# Patient Record
Sex: Male | Born: 1955 | Race: Black or African American | Hispanic: No | Marital: Single | Smoking: Current every day smoker
Health system: Southern US, Community
[De-identification: ages and names within clinical notes are randomized; demographics above are authoritative.]

## PROBLEM LIST (undated history)

## (undated) DIAGNOSIS — I10 Essential (primary) hypertension: Secondary | ICD-10-CM

## (undated) HISTORY — PX: ROTATOR CUFF REPAIR: SHX139

---

## 2016-09-10 ENCOUNTER — Emergency Department (HOSPITAL_BASED_OUTPATIENT_CLINIC_OR_DEPARTMENT_OTHER): Payer: BLUE CROSS/BLUE SHIELD

## 2016-09-10 ENCOUNTER — Encounter (HOSPITAL_BASED_OUTPATIENT_CLINIC_OR_DEPARTMENT_OTHER): Payer: Self-pay | Admitting: Emergency Medicine

## 2016-09-10 ENCOUNTER — Emergency Department (HOSPITAL_BASED_OUTPATIENT_CLINIC_OR_DEPARTMENT_OTHER)
Admission: EM | Admit: 2016-09-10 | Discharge: 2016-09-10 | Disposition: A | Discharge status: Home / Self Care | Payer: BLUE CROSS/BLUE SHIELD | Attending: Emergency Medicine | Admitting: Emergency Medicine

## 2016-09-10 DIAGNOSIS — B9789 Other viral agents as the cause of diseases classified elsewhere: Secondary | ICD-10-CM

## 2016-09-10 DIAGNOSIS — F17210 Nicotine dependence, cigarettes, uncomplicated: Secondary | ICD-10-CM | POA: Insufficient documentation

## 2016-09-10 DIAGNOSIS — J069 Acute upper respiratory infection, unspecified: Secondary | ICD-10-CM | POA: Diagnosis not present

## 2016-09-10 DIAGNOSIS — I10 Essential (primary) hypertension: Secondary | ICD-10-CM | POA: Insufficient documentation

## 2016-09-10 DIAGNOSIS — R05 Cough: Secondary | ICD-10-CM | POA: Diagnosis present

## 2016-09-10 HISTORY — DX: Essential (primary) hypertension: I10

## 2016-09-10 MED ORDER — LORATADINE 10 MG PO TABS: 10.0000 mg | ORAL_TABLET | Freq: Every day | ORAL | 0 refills | Status: DC

## 2016-09-10 MED ORDER — DM-GUAIFENESIN ER 30-600 MG PO TB12: 1.0000 | ORAL_TABLET | Freq: Two times a day (BID) | ORAL | 0 refills | Status: DC | PRN

## 2016-09-10 NOTE — ED Notes (Signed)
Patient given pedialyte, ok by [place].

## 2016-09-10 NOTE — ED Triage Notes (Signed)
Productive cough for a week , and H/A last night. Denies n/v or diarrhea

## 2016-09-10 NOTE — ED Notes (Signed)
ED Provider at bedside. 

## 2016-09-10 NOTE — ED Notes (Signed)
Patient c/o chronic cough with white mucous, ongoing for "a long time". Patient is a long term smoker, average 1 ppd. Patient denies any pain at this time, is A&Ox4, NAD noted. Patient using cough drops at home without significant relief. Patient states he had a headache last night, treated with "2 tylenol" and it is gone now.

## 2016-09-10 NOTE — ED Provider Notes (Signed)
[facility]   CSN: [id number] Arrival date & time: 09/10/16  1033     History   Chief Complaint Chief Complaint  Patient presents with  . Cough    HPI Charles Kaufman is a 61 y.o. male.  The history is provided by the patient.  Cough  This is a new problem. Episode onset: 1 week. The problem occurs constantly. The problem has not changed since onset.The cough is productive of sputum. There has been no fever. Pertinent negatives include no shortness of breath. He has tried nothing for the symptoms. He is a smoker (1/2 ppd). His past medical history does not include COPD or asthma.    Past Medical History:  Diagnosis Date  . Hypertension     There are no active problems to display for this patient.   Past Surgical History:  Procedure Laterality Date  . ROTATOR CUFF REPAIR Right        Home Medications    Prior to Admission medications   Not on File    Family History No family history on file.  Social History Social History  Substance Use Topics  . Smoking status: Current Every Day Smoker    Packs/day: 0.50    Types: Cigarettes  . Smokeless tobacco: Never Used  . Alcohol use No     Allergies   Sulfa antibiotics   Review of Systems Review of Systems  Respiratory: Positive for cough. Negative for shortness of breath.   All other systems reviewed and are negative.    Physical Exam Updated Vital Signs BP 115/77 (BP Location: Right Arm)   Pulse (!) 50   Temp 98.2 F (36.8 C) (Oral)   Resp 18   Ht 5\' 4"  (1.626 m)   Wt 180 lb (81.6 kg)   SpO2 98%   BMI 30.90 kg/m   Physical Exam  Constitutional: He is oriented to person, place, and time. He appears well-developed and well-nourished. No distress.  HENT:  Head: Normocephalic and atraumatic.  Nose: Nose normal.  Mouth/Throat: Oropharynx is clear and moist.  Eyes: Conjunctivae are normal.  Neck: Neck supple. No tracheal deviation present.  Cardiovascular: Normal  rate, regular rhythm and normal heart sounds.   Pulmonary/Chest: Effort normal and breath sounds normal. No respiratory distress. He has no wheezes. He has no rales.  Abdominal: Soft. He exhibits no distension.  Neurological: He is alert and oriented to person, place, and time.  Skin: Skin is warm and dry. Capillary refill takes less than 2 seconds.  Psychiatric: He has a normal mood and affect.  Vitals reviewed.    ED Treatments / Results  Labs (all labs ordered are listed, but only abnormal results are displayed) Labs Reviewed - No data to display  EKG  EKG Interpretation None       Radiology Dg Chest 2 View  Result Date: 09/10/2016 CLINICAL DATA:  Productive cough for 1 week. EXAM: CHEST  2 VIEW COMPARISON:  None. FINDINGS: The heart size and mediastinal contours are within normal limits. Both lungs are clear. The visualized skeletal structures are unremarkable. IMPRESSION: No active cardiopulmonary disease. Electronically Signed   By: Ted Mcalpine M.D.   On: 09/10/2016 12:03    Procedures Procedures (including critical care time)  Medications Ordered in ED Medications - No data to display   Initial Impression / Assessment and Plan / ED Course  I have reviewed the triage vital signs and the nursing notes.  Pertinent labs & imaging results that were available  during my care of the patient were reviewed by me and considered in my medical decision making (see chart for details).     61 year old well-appearing male presents with upper respiratory infection symptoms including runny nose, cough, congestion and sputum production. Chest x-ray is negative for pneumonia. He is breathing well without signs of impending respiratory failure. Plan will be for supportive care with antihistamines and cough suppression/expectorant.  Discussed smoking cessation with patient and was they were offerred resources to help stop.  Total time was 5 min CPT code [id number].    Final  Clinical Impressions(s) / ED Diagnoses   Final diagnoses:  Viral URI with cough    New Prescriptions New Prescriptions   DEXTROMETHORPHAN-GUAIFENESIN (MUCINEX DM) 30-600 MG 12HR TABLET    Take 1 tablet by mouth 2 (two) times daily as needed for cough.   LORATADINE (CLARITIN) 10 MG TABLET    Take 1 tablet (10 mg total) by mouth daily.     Lyndal Pulleyaniel Odel Schmid, MD 09/10/16 [phone]

## 2017-04-25 ENCOUNTER — Encounter (HOSPITAL_BASED_OUTPATIENT_CLINIC_OR_DEPARTMENT_OTHER): Payer: Self-pay

## 2017-04-25 ENCOUNTER — Emergency Department (HOSPITAL_BASED_OUTPATIENT_CLINIC_OR_DEPARTMENT_OTHER)
Admission: EM | Admit: 2017-04-25 | Discharge: 2017-04-25 | Disposition: A | Discharge status: Home / Self Care | Payer: BLUE CROSS/BLUE SHIELD | Attending: Emergency Medicine | Admitting: Emergency Medicine

## 2017-04-25 DIAGNOSIS — I10 Essential (primary) hypertension: Secondary | ICD-10-CM | POA: Diagnosis not present

## 2017-04-25 DIAGNOSIS — F17210 Nicotine dependence, cigarettes, uncomplicated: Secondary | ICD-10-CM | POA: Diagnosis not present

## 2017-04-25 DIAGNOSIS — M25531 Pain in right wrist: Secondary | ICD-10-CM | POA: Diagnosis present

## 2017-04-25 MED ORDER — IBUPROFEN 400 MG PO TABS
600.0000 mg | ORAL_TABLET | Freq: Once | ORAL | Status: AC
  Administered 2017-04-25: 12:00:00 600 mg via ORAL
  Filled 2017-04-25: qty 1

## 2017-04-25 MED ORDER — IBUPROFEN 600 MG PO TABS: 600.0000 mg | ORAL_TABLET | Freq: Three times a day (TID) | ORAL | 0 refills | Status: AC

## 2017-04-25 MED FILL — IBUPROFEN 600 MG TABLET: 600 | 10 days supply | Qty: 30 | Fill #0

## 2017-04-25 NOTE — ED Notes (Signed)
Pt verbalized understanding of discharge instructions and denies any further questions at this time.   

## 2017-04-25 NOTE — ED Provider Notes (Signed)
[facility]   CSN: [id number] Arrival date & time: 04/25/17  1113     History   Chief Complaint Chief Complaint  Patient presents with  . Wrist Pain    HPI Charles Kaufman is a 61 y.o. male presenting with right hand pain.  Patient states that while he was at work yesterday, he started to develop some ulnar right hand/wrist pain. It gradually increased, and when he woke up this morning, pain was more severe. He has not taken anything for pain. He states he uses his hands a lot for work, and yesterday he was picking up garbage bags. He is right-handed. He denies numbness or tingling. He denies fall, trauma, or injury to the wrist. He denies history of problems with the wrist. He has no pain on the radial aspect, in his palm, or in his fingers.  He denies fevers or chills. Movement makes the pain worse, nothing has made it better.  HPI  Past Medical History:  Diagnosis Date  . Hypertension     There are no active problems to display for this patient.   Past Surgical History:  Procedure Laterality Date  . ROTATOR CUFF REPAIR Right        Home Medications    Prior to Admission medications   Medication Sig Start Date End Date Taking? Authorizing Provider  ibuprofen (ADVIL,MOTRIN) 600 MG tablet Take 1 tablet (600 mg total) by mouth every 8 (eight) hours. 04/25/17   Bryceton Hantz, PA-C    Family History No family history on file.  Social History Social History  Substance Use Topics  . Smoking status: Current Every Day Smoker    Packs/day: 0.50    Types: Cigarettes  . Smokeless tobacco: Never Used  . Alcohol use No     Allergies   Sulfa antibiotics   Review of Systems Review of Systems  Constitutional: Negative for chills and fever.  Musculoskeletal: Positive for arthralgias.  Skin: Negative for wound.  Neurological: Negative for numbness.     Physical Exam Updated Vital Signs BP (!) 144/64 (BP Location: Left Arm)   Pulse  (!) 57   Temp 98.2 F (36.8 C) (Oral)   Resp 16   Ht  (1.626 m)   Wt 80.1 kg (176 lb 9.4 oz)   SpO2 97%   BMI 30.31 kg/m   Physical Exam  Constitutional: He is oriented to person, place, and time. He appears well-developed and well-nourished. No distress.  HENT:  Head: Normocephalic and atraumatic.  Eyes: EOM are normal.  Neck: Normal range of motion.  Pulmonary/Chest: Effort normal.  Abdominal: He exhibits no distension.  Musculoskeletal: Normal range of motion.       Arms: Tenderness to palpation of the ulnar aspect of the right wrist and hand. No obvious swelling of the wrist or hand. No lacerations, contusions, or injury. Patient with full range of motion of his fingers without pain. Grip strength equal bilaterally. Radial pulses equal bilaterally. Sensation intact bilaterally. No pain with passive range of motion of the wrist. No erythema or warmth. No pain in the elbow. No tenderness to palpation of the radial aspect of the wrist or hand.  Neurological: He is alert and oriented to person, place, and time. No sensory deficit.  Skin: Skin is warm. No rash noted.  Psychiatric: He has a normal mood and affect.  Nursing note and vitals reviewed.    ED Treatments / Results  Labs (all labs ordered are listed, but only abnormal  results are displayed) Labs Reviewed - No data to display  EKG  EKG Interpretation None       Radiology No results found.  Procedures Procedures (including critical care time)  Medications Ordered in ED Medications  ibuprofen (ADVIL,MOTRIN) tablet 600 mg (600 mg Oral Given 04/25/17 1208)     Initial Impression / Assessment and Plan / ED Course  I have reviewed the triage vital signs and the nursing notes.  Pertinent labs & imaging results that were available during my care of the patient were reviewed by me and considered in my medical decision making (see chart for details).     Patient presenting with ulnar wrist/hand pain,  worsened with activity. Physical exam reassuring, as pain crosses several joints, likely not gout, septic joint, or injury of a joint. Likely muscular or ligamentous. At this time, I do not believe x-rays are indicated. Patient neurovascularly intact. Will treat conservatively with anti-inflammatories and a brace. Patient to follow-up with primary care if symptoms are not improving. At this time, patient appears safe for discharge. Return precautions given. Patient states he understands and agrees to plan.  Final Clinical Impressions(s) / ED Diagnoses   Final diagnoses:  Right wrist pain    New Prescriptions Discharge Medication List as of 04/25/2017 11:55 AM    START taking these medications   Details  ibuprofen (ADVIL,MOTRIN) 600 MG tablet Take 1 tablet (600 mg total) by mouth every 8 (eight) hours., Starting Wed 04/25/2017, Print         [place], [place], PA-C 04/25/17 1309    Melene PlanFloyd, Dan, DO 04/25/17 1534

## 2017-04-25 NOTE — Discharge Instructions (Signed)
Take ibuprofen 3 times a day with meals. Do not take other anti-inflammatories at the same time (Advil, Motrin, naproxen, Aleve). You may supplement with Tylenol as needed for further pain. Take ibuprofen for the next 7 days. You may use ice as needed for pain and swelling. Use the wrist brace when working and as needed for symptom control. Do not sleep in this. Follow-up with your primary care doctor in 1 week if pain is not improving. Return to the emergency room if you develop swelling and redness of your wrist, numbness that does not improve, or any new or worsening symptoms.

## 2017-04-25 NOTE — ED Triage Notes (Signed)
C/o pain to right wrist x today-denies specific injury-his work does involve lifting/moving beds-NAD-steady gait

## 2017-06-10 ENCOUNTER — Emergency Department (HOSPITAL_BASED_OUTPATIENT_CLINIC_OR_DEPARTMENT_OTHER): Payer: BLUE CROSS/BLUE SHIELD

## 2017-06-10 ENCOUNTER — Emergency Department (HOSPITAL_BASED_OUTPATIENT_CLINIC_OR_DEPARTMENT_OTHER)
Admission: EM | Admit: 2017-06-10 | Discharge: 2017-06-11 | Disposition: A | Discharge status: Home / Self Care | Payer: BLUE CROSS/BLUE SHIELD | Attending: Emergency Medicine | Admitting: Emergency Medicine

## 2017-06-10 ENCOUNTER — Encounter (HOSPITAL_BASED_OUTPATIENT_CLINIC_OR_DEPARTMENT_OTHER): Payer: Self-pay | Admitting: Emergency Medicine

## 2017-06-10 DIAGNOSIS — I10 Essential (primary) hypertension: Secondary | ICD-10-CM | POA: Diagnosis not present

## 2017-06-10 DIAGNOSIS — M79661 Pain in right lower leg: Secondary | ICD-10-CM | POA: Insufficient documentation

## 2017-06-10 DIAGNOSIS — F17210 Nicotine dependence, cigarettes, uncomplicated: Secondary | ICD-10-CM | POA: Insufficient documentation

## 2017-06-10 DIAGNOSIS — Z79899 Other long term (current) drug therapy: Secondary | ICD-10-CM | POA: Diagnosis not present

## 2017-06-10 DIAGNOSIS — M79662 Pain in left lower leg: Secondary | ICD-10-CM | POA: Insufficient documentation

## 2017-06-10 DIAGNOSIS — M79641 Pain in right hand: Secondary | ICD-10-CM

## 2017-06-10 LAB — CBC WITH DIFFERENTIAL/PLATELET
Basophils Absolute: 0 10*3/uL (ref 0.0–0.1)
Basophils Relative: 0 %
EOS ABS: 0.1 10*3/uL (ref 0.0–0.7)
EOS PCT: 2 %
HCT: 40.6 % (ref 39.0–52.0)
Hemoglobin: 13.5 g/dL (ref 13.0–17.0)
LYMPHS ABS: 2.5 10*3/uL (ref 0.7–4.0)
LYMPHS PCT: 34 %
MCH: 32.5 pg (ref 26.0–34.0)
MCHC: 33.3 g/dL (ref 30.0–36.0)
MCV: 97.8 fL (ref 78.0–100.0)
MONOS PCT: 5 %
Monocytes Absolute: 0.4 10*3/uL (ref 0.1–1.0)
Neutro Abs: 4.4 10*3/uL (ref 1.7–7.7)
Neutrophils Relative %: 59 %
PLATELETS: 142 10*3/uL — AB (ref 150–400)
RBC: 4.15 MIL/uL — ABNORMAL LOW (ref 4.22–5.81)
RDW: 13.9 % (ref 11.5–15.5)
WBC: 7.4 10*3/uL (ref 4.0–10.5)

## 2017-06-10 NOTE — ED Triage Notes (Signed)
R hand and bilateral leg pain for 1 week. Denies injury.

## 2017-06-10 NOTE — ED Provider Notes (Signed)
[facility] EMERGENCY DEPARTMENT Provider Note   CSN: [id number] Arrival date & time: 06/10/17  1709     History   Chief Complaint Chief Complaint  Patient presents with  . Hand Pain  . Leg Pain    HPI Charles Kaufman is a 61 y.o. male.  Patient is a 61 year old male with past medical history of hypertension presenting today for evaluation of pain in his right hand.  This is been ongoing for the past month and began in the absence of any injury or trauma.  He reports performing repetitive motions and heavy lifting at work.  He was seen here 2 weeks ago and prescribed NSAIDs, however this is not helping.  He is also describing pain in his calves.  He reports cramping at night and cramping intermittently with ambulation.  He denies any chest pain or difficulty breathing.  He denies any leg swelling.   The history is provided by the patient.  Hand Pain  This is a new problem. Episode onset: 1 month ago. The problem occurs constantly. The problem has been gradually worsening. Pertinent negatives include no chest pain. Exacerbated by: Movement and palpation. Nothing relieves the symptoms. He has tried nothing for the symptoms. The treatment provided no relief.    Past Medical History:  Diagnosis Date  . Hypertension     There are no active problems to display for this patient.   Past Surgical History:  Procedure Laterality Date  . ROTATOR CUFF REPAIR Right        Home Medications    Prior to Admission medications   Medication Sig Start Date End Date Taking? Authorizing Provider  lisinopril-hydrochlorothiazide (PRINZIDE,ZESTORETIC) 10-12.5 MG tablet Take 1 tablet by mouth daily.   Yes [provider]  ibuprofen (ADVIL,MOTRIN) 600 MG tablet Take 1 tablet (600 mg total) by mouth every 8 (eight) hours. 04/25/17   Caccavale, Sophia, PA-C    Family History No family history on file.  Social History Social History  Substance Use Topics  . Smoking status:  Current Every Day Smoker    Packs/day: 0.50    Types: Cigarettes  . Smokeless tobacco: Never Used  . Alcohol use No     Allergies   Sulfa antibiotics   Review of Systems Review of Systems  Cardiovascular: Negative for chest pain.  All other systems reviewed and are negative.    Physical Exam Updated Vital Signs BP 138/64 (BP Location: Left Arm)   Pulse (!) 51   Temp 98 F (36.7 C) (Oral)   Resp 16   Ht 5\' 4"  (1.626 m)   Wt 80.7 kg (178 lb)   SpO2 98%   BMI 30.55 kg/m   Physical Exam  Constitutional: He is oriented to person, place, and time. He appears well-developed and well-nourished. No distress.  HENT:  Head: Normocephalic and atraumatic.  Mouth/Throat: Oropharynx is clear and moist.  Neck: Normal range of motion. Neck supple.  Cardiovascular: Normal rate and regular rhythm.  Exam reveals no friction rub.   No murmur heard. Pulmonary/Chest: Effort normal and breath sounds normal. No respiratory distress. He has no wheezes. He has no rales.  Abdominal: Soft. Bowel sounds are normal. He exhibits no distension. There is no tenderness.  Musculoskeletal: Normal range of motion. He exhibits no edema.  The right hand appears grossly normal.  He reports discomfort in the area of the MCP joints.  There is no obvious swelling, erythema, or other abnormality.  Ulnar and radial pulses are easily palpable.  Capillary refill is brisk to all fingers.  The lower extremities appear grossly normal and symmetrical.  There is no edema or swelling.  There is no redness or erythema.  DP pulses are easily palpable.  Motor and sensation is intact to both feet.  Neurological: He is alert and oriented to person, place, and time. Coordination normal.  Skin: Skin is warm and dry. He is not diaphoretic.  Nursing note and vitals reviewed.    ED Treatments / Results  Labs (all labs ordered are listed, but only abnormal results are displayed) Labs Reviewed  BASIC METABOLIC PANEL  CBC  WITH DIFFERENTIAL/PLATELET    EKG  EKG Interpretation None       Radiology No results found.  Procedures Procedures (including critical care time)  Medications Ordered in ED Medications - No data to display   Initial Impression / Assessment and Plan / ED Course  I have reviewed the triage vital signs and the nursing notes.  Pertinent labs & imaging results that were available during my care of the patient were reviewed by me and considered in my medical decision making (see chart for details).  Hand x-rays are negative.  Patient's laboratory studies reveal no acute abnormality.  His blood counts are normal and his potassium is normal.  I am uncertain as to the etiology of his calf cramping, however I suspect a musculoskeletal etiology.  His pain is bilateral and I highly doubt DVT.  He will be treated with prednisone for his hand pain since the ibuprofen he was prescribed is not working.  He is also requested a work note which he will be given.  He is to follow-up with his primary doctor if not improving.  Final Clinical Impressions(s) / ED Diagnoses   Final diagnoses:  None    New Prescriptions New Prescriptions   No medications on file     Geoffery Lyonselo, Taveon Enyeart, MD 06/11/17 [phone]

## 2017-06-10 NOTE — ED Notes (Signed)
EMT attempted stick X2. Successful for one tube the second time.

## 2017-06-11 LAB — BASIC METABOLIC PANEL
ANION GAP: 6 (ref 5–15)
BUN: 12 mg/dL (ref 6–20)
CHLORIDE: 105 mmol/L (ref 101–111)
CO2: 25 mmol/L (ref 22–32)
Calcium: 8.8 mg/dL — ABNORMAL LOW (ref 8.9–10.3)
Creatinine, Ser: 1.16 mg/dL (ref 0.61–1.24)
GFR calc Af Amer: >60 mL/min (ref >60)
GFR calc non Af Amer: >60 mL/min (ref >60)
GLUCOSE: 102 mg/dL — AB (ref 65–99)
POTASSIUM: 3.6 mmol/L (ref 3.5–5.1)
Sodium: 136 mmol/L (ref 135–145)

## 2017-06-11 MED ORDER — PREDNISONE 10 MG PO TABS: 20.0000 mg | ORAL_TABLET | Freq: Two times a day (BID) | ORAL | 0 refills | Status: AC

## 2017-06-11 NOTE — Discharge Instructions (Signed)
Prednisone as prescribed.  Continue ibuprofen 600 mg every 6 hours as needed for pain.  Follow-up with your primary doctor if your symptoms are not improving in the next week.

## 2017-12-27 IMAGING — CR DG CHEST 2V
2 series · 2 of 2 positions shown · non-contrast
Comparison: None.

CLINICAL DATA: Productive cough for 1 week.

EXAM:
CHEST  2 VIEW

[w chest pa]
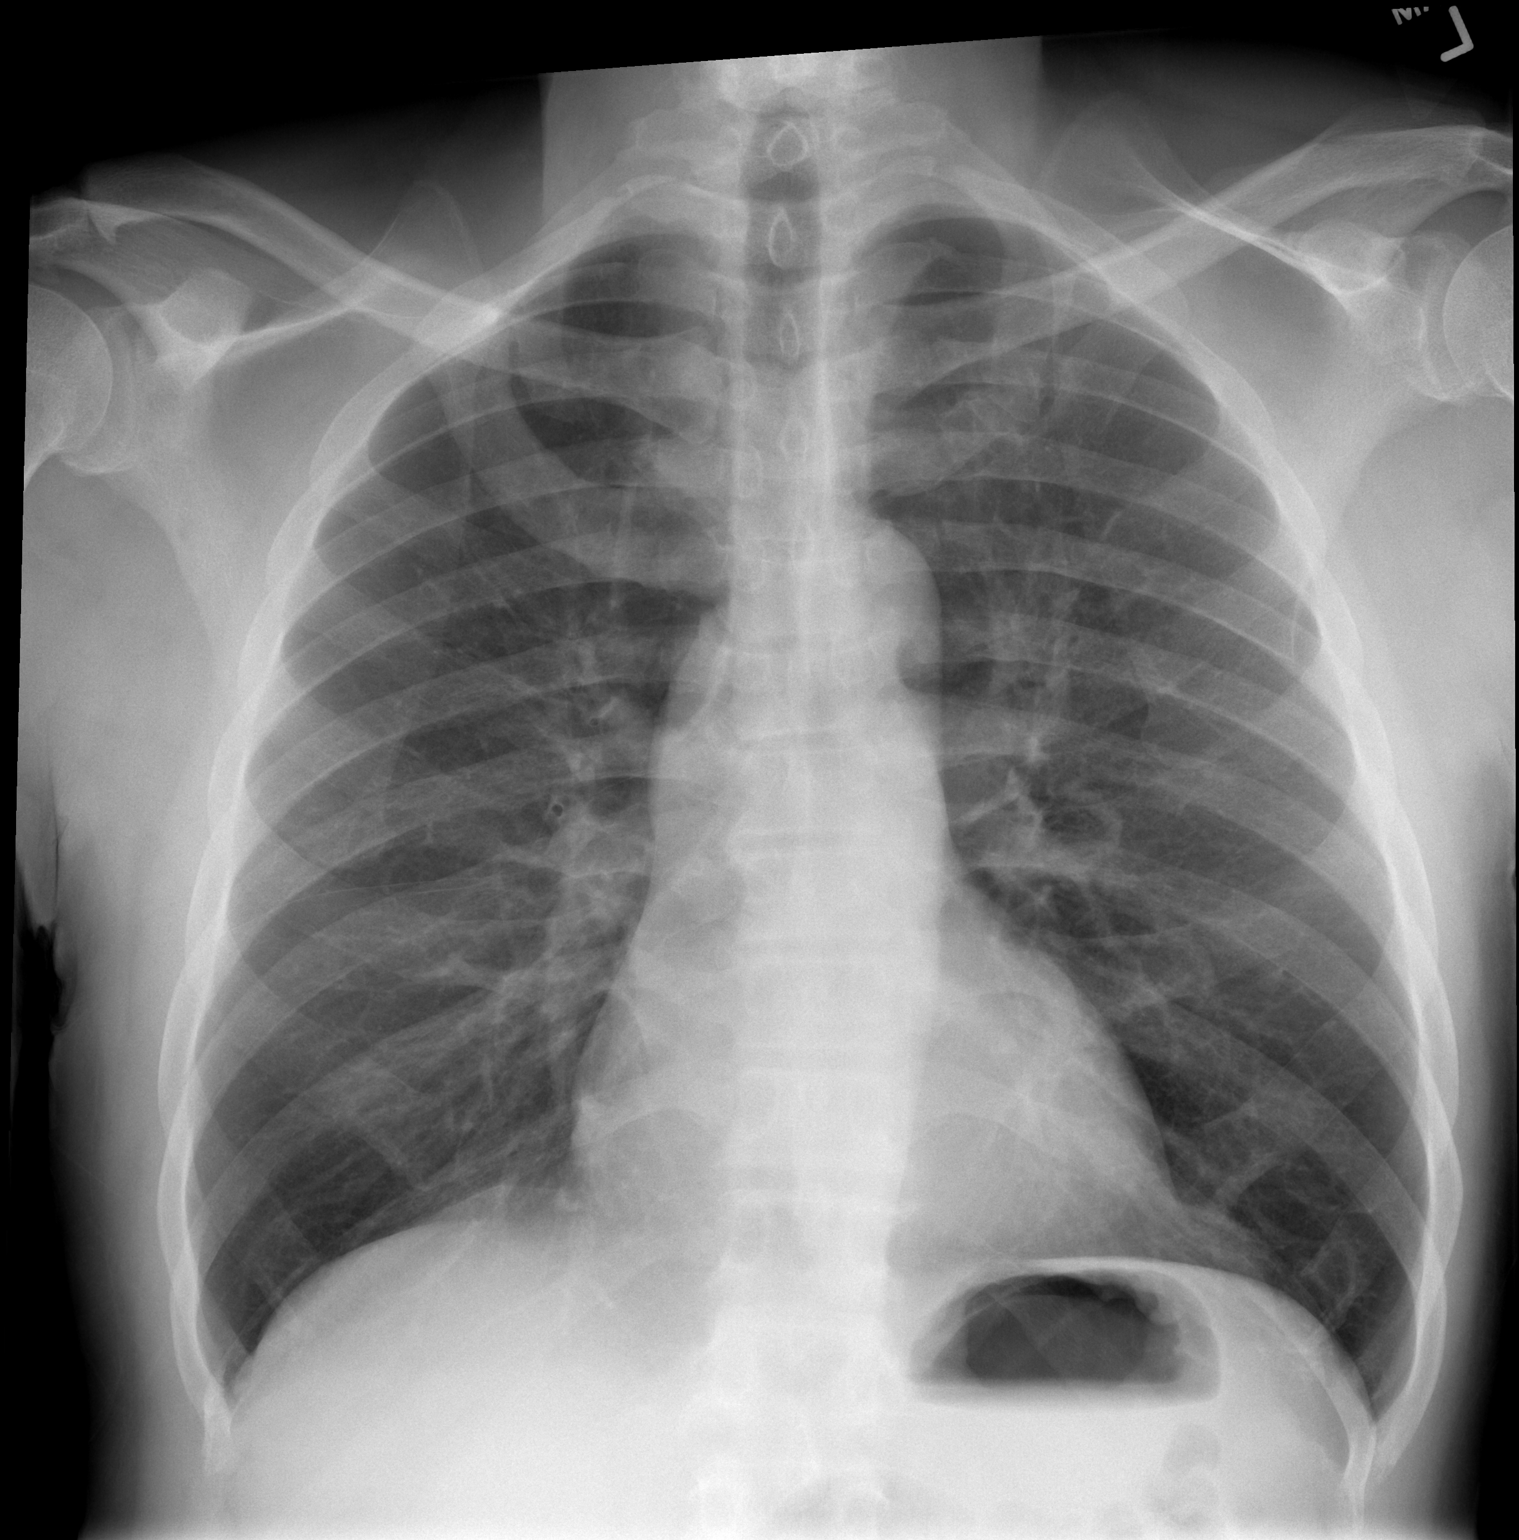

[w chest lat]
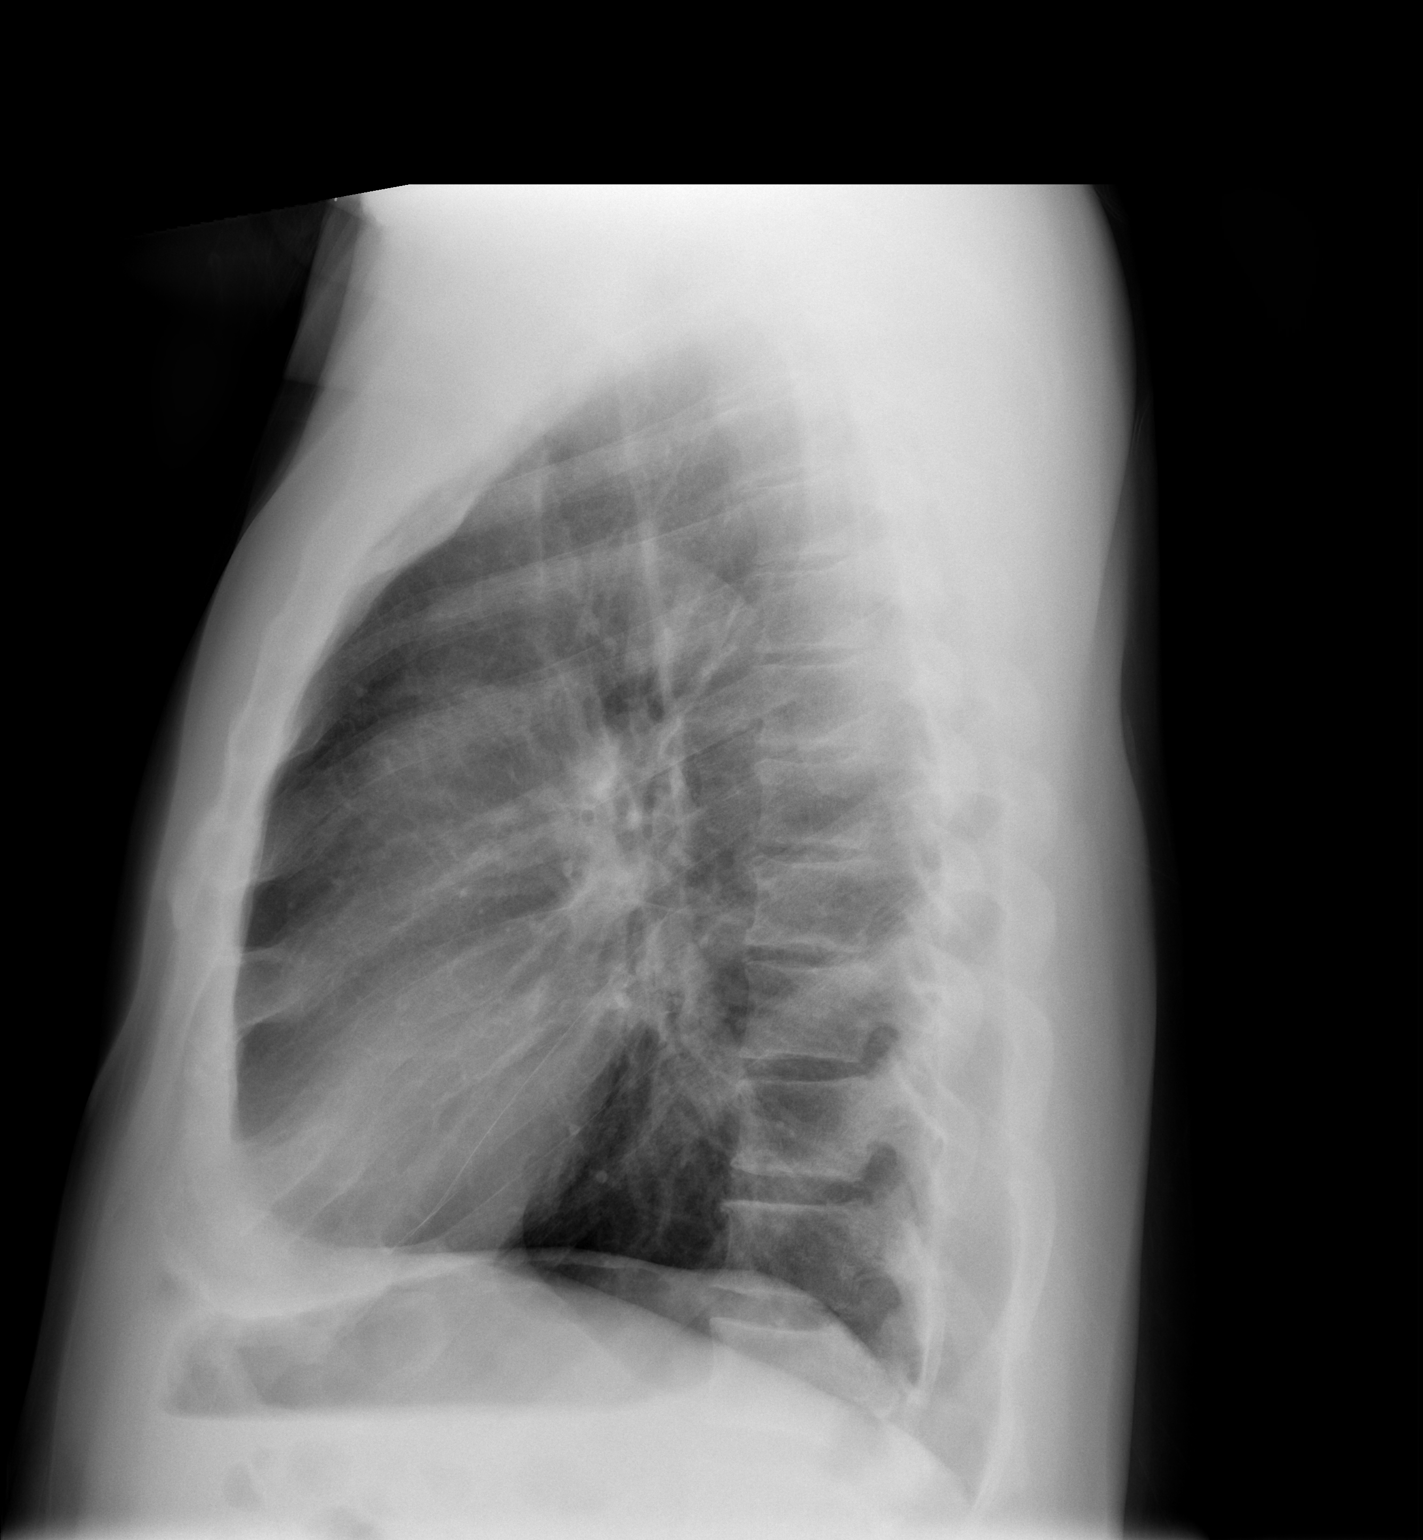

[2 of 2 positions shown; findings below may reference images not displayed]

FINDINGS: The heart size and mediastinal contours are within normal limits.
Both lungs are clear. The visualized skeletal structures are
unremarkable.
IMPRESSION: No active cardiopulmonary disease.

## 2018-08-24 ENCOUNTER — Other Ambulatory Visit: Payer: Self-pay

## 2018-08-24 ENCOUNTER — Emergency Department (HOSPITAL_BASED_OUTPATIENT_CLINIC_OR_DEPARTMENT_OTHER)
Admission: EM | Admit: 2018-08-24 | Discharge: 2018-08-25 | Disposition: A | Discharge status: Home / Self Care | Payer: Medicaid Other | Attending: Emergency Medicine | Admitting: Emergency Medicine

## 2018-08-24 ENCOUNTER — Encounter (HOSPITAL_BASED_OUTPATIENT_CLINIC_OR_DEPARTMENT_OTHER): Payer: Self-pay | Admitting: *Deleted

## 2018-08-24 DIAGNOSIS — Z79899 Other long term (current) drug therapy: Secondary | ICD-10-CM | POA: Diagnosis not present

## 2018-08-24 DIAGNOSIS — F17210 Nicotine dependence, cigarettes, uncomplicated: Secondary | ICD-10-CM | POA: Insufficient documentation

## 2018-08-24 DIAGNOSIS — R109 Unspecified abdominal pain: Secondary | ICD-10-CM | POA: Insufficient documentation

## 2018-08-24 DIAGNOSIS — I10 Essential (primary) hypertension: Secondary | ICD-10-CM | POA: Insufficient documentation

## 2018-08-24 NOTE — ED Triage Notes (Addendum)
Pt reports he has a "bad kidney". Reports RLQ pain. Denies n/v/d, denies hematuria. Pt is poor historian

## 2018-08-25 ENCOUNTER — Emergency Department (HOSPITAL_BASED_OUTPATIENT_CLINIC_OR_DEPARTMENT_OTHER): Payer: Medicaid Other

## 2018-08-25 LAB — CBC WITH DIFFERENTIAL/PLATELET
ABS IMMATURE GRANULOCYTES: 0.01 10*3/uL (ref 0.00–0.07)
Basophils Absolute: 0 10*3/uL (ref 0.0–0.1)
Basophils Relative: 1 %
Eosinophils Absolute: 0.3 10*3/uL (ref 0.0–0.5)
Eosinophils Relative: 3 %
HCT: 40 % (ref 39.0–52.0)
Hemoglobin: 12.8 g/dL — ABNORMAL LOW (ref 13.0–17.0)
IMMATURE GRANULOCYTES: 0 %
Lymphocytes Relative: 37 %
Lymphs Abs: 2.9 10*3/uL (ref 0.7–4.0)
MCH: 32.5 pg (ref 26.0–34.0)
MCHC: 32 g/dL (ref 30.0–36.0)
MCV: 101.5 fL — ABNORMAL HIGH (ref 80.0–100.0)
MONO ABS: 0.5 10*3/uL (ref 0.1–1.0)
Monocytes Relative: 6 %
NEUTROS ABS: 4.3 10*3/uL (ref 1.7–7.7)
NEUTROS PCT: 53 %
NRBC: 0 % (ref 0.0–0.2)
Platelets: 136 10*3/uL — ABNORMAL LOW (ref 150–400)
RBC: 3.94 MIL/uL — ABNORMAL LOW (ref 4.22–5.81)
RDW: 14.9 % (ref 11.5–15.5)
WBC: 8 10*3/uL (ref 4.0–10.5)

## 2018-08-25 LAB — URINALYSIS, MICROSCOPIC (REFLEX)

## 2018-08-25 LAB — URINALYSIS, ROUTINE W REFLEX MICROSCOPIC
Bilirubin Urine: NEGATIVE
Glucose, UA: NEGATIVE mg/dL
Ketones, ur: NEGATIVE mg/dL
Leukocytes, UA: NEGATIVE
NITRITE: NEGATIVE
PROTEIN: 30 mg/dL — AB
Specific Gravity, Urine: >1.03 — ABNORMAL HIGH (ref 1.005–1.030)
pH: 5.5 (ref 5.0–8.0)

## 2018-08-25 LAB — COMPREHENSIVE METABOLIC PANEL
ALT: 12 U/L (ref 0–44)
AST: 18 U/L (ref 15–41)
Albumin: 3.7 g/dL (ref 3.5–5.0)
Alkaline Phosphatase: 74 U/L (ref 38–126)
Anion gap: 8 (ref 5–15)
BUN: 13 mg/dL (ref 8–23)
CO2: 23 mmol/L (ref 22–32)
Calcium: 8.6 mg/dL — ABNORMAL LOW (ref 8.9–10.3)
Chloride: 109 mmol/L (ref 98–111)
Creatinine, Ser: 1.25 mg/dL — ABNORMAL HIGH (ref 0.61–1.24)
GFR calc Af Amer: >60 mL/min (ref >60)
Glucose, Bld: 123 mg/dL — ABNORMAL HIGH (ref 70–99)
POTASSIUM: 3.7 mmol/L (ref 3.5–5.1)
Sodium: 140 mmol/L (ref 135–145)
TOTAL PROTEIN: 6.9 g/dL (ref 6.5–8.1)
Total Bilirubin: 0.6 mg/dL (ref 0.3–1.2)

## 2018-08-25 MED ORDER — KETOROLAC TROMETHAMINE 30 MG/ML IJ SOLN
15.0000 mg | Freq: Once | INTRAMUSCULAR | Status: AC
Start: 2018-08-25 — End: 2018-08-25
  Administered 2018-08-25: 15 mg via INTRAVENOUS
  Filled 2018-08-25: qty 1

## 2018-08-25 NOTE — ED Provider Notes (Signed)
[facility] EMERGENCY DEPARTMENT Provider Note   CSN: [id number] Arrival date & time: 08/24/18  2323     History   Chief Complaint Chief Complaint  Patient presents with  . Abdominal Pain    HPI Charles Kaufman is a 63 y.o. male.  The history is provided by the patient.  Flank Pain  This is a new problem. The current episode started more than 2 days ago. The problem has not changed since onset.Pertinent negatives include no chest pain, no headaches and no shortness of breath. Nothing aggravates the symptoms. Nothing relieves the symptoms. He has tried nothing for the symptoms. The treatment provided no relief.  Waxing and waning left flank pain since drinking soda on Wednesday.  Drinking soda makes it worse.  No n/v/d.  No diarrhea no constipation.    Past Medical History:  Diagnosis Date  . Hypertension     There are no active problems to display for this patient.   Past Surgical History:  Procedure Laterality Date  . ROTATOR CUFF REPAIR Right         Home Medications    Prior to Admission medications   Medication Sig Start Date End Date Taking? Authorizing Provider  lisinopril-hydrochlorothiazide (PRINZIDE,ZESTORETIC) 10-12.5 MG tablet Take 1 tablet by mouth daily.   Yes [provider]  ibuprofen (ADVIL,MOTRIN) 600 MG tablet Take 1 tablet (600 mg total) by mouth every 8 (eight) hours. 04/25/17   Caccavale, Sophia, PA-C  predniSONE (DELTASONE) 10 MG tablet Take 2 tablets (20 mg total) by mouth 2 (two) times daily. 06/11/17   Geoffery Lyons, MD    Family History No family history on file.  Social History Social History   Tobacco Use  . Smoking status: Current Every Day Smoker    Packs/day: 0.50    Types: Cigarettes  . Smokeless tobacco: Never Used  Substance Use Topics  . Alcohol use: No  . Drug use: Not Currently    Types: Marijuana     Allergies   Sulfa antibiotics   Review of Systems Review of Systems  Respiratory: Negative  for shortness of breath.   Cardiovascular: Negative for chest pain.  Gastrointestinal: Negative for constipation, diarrhea, nausea and vomiting.  Genitourinary: Positive for flank pain. Negative for dysuria.  Neurological: Negative for headaches.  All other systems reviewed and are negative.    Physical Exam Updated Vital Signs BP (!) 174/91 (BP Location: Left Arm)   Pulse 63   Temp 98.9 F (37.2 C) (Oral)   Resp 18   SpO2 98%   Physical Exam Constitutional:      Appearance: He is well-developed.  HENT:     Head: Normocephalic and atraumatic.     Mouth/Throat:     Mouth: Mucous membranes are moist.  Eyes:     Extraocular Movements: Extraocular movements intact.  Cardiovascular:     Rate and Rhythm: Normal rate and regular rhythm.     Heart sounds: Normal heart sounds.  Pulmonary:     Effort: Pulmonary effort is normal.     Breath sounds: Normal breath sounds.  Abdominal:     General: Bowel sounds are normal.     Palpations: Abdomen is soft. There is no mass.     Tenderness: There is no abdominal tenderness. There is no guarding or rebound.     Hernia: No hernia is present.  Musculoskeletal: Normal range of motion.  Skin:    General: Skin is warm and dry.     Capillary Refill: Capillary refill  takes less than 2 seconds.  Neurological:     General: No focal deficit present.     Mental Status: He is alert and oriented to person, place, and time.  Psychiatric:        Mood and Affect: Mood normal.        Behavior: Behavior normal.      ED Treatments / Results  Labs (all labs ordered are listed, but only abnormal results are displayed) Labs Reviewed  COMPREHENSIVE METABOLIC PANEL - Abnormal; Notable for the following components:      Result Value   Glucose, Bld 123 (*)    Creatinine, Ser 1.25 (*)    Calcium 8.6 (*)    All other components within normal limits  URINALYSIS, ROUTINE W REFLEX MICROSCOPIC - Abnormal; Notable for the following components:   Specific  Gravity, Urine >1.030 (*)    Hgb urine dipstick TRACE (*)    Protein, ur 30 (*)    All other components within normal limits  CBC WITH DIFFERENTIAL/PLATELET - Abnormal; Notable for the following components:   RBC 3.94 (*)    Hemoglobin 12.8 (*)    MCV 101.5 (*)    Platelets 136 (*)    All other components within normal limits  URINALYSIS, MICROSCOPIC (REFLEX) - Abnormal; Notable for the following components:   Bacteria, UA RARE (*)    All other components within normal limits    EKG None  Radiology Ct Renal Stone Study  Result Date: 08/25/2018 CLINICAL DATA:  63 y/o M; 5 days of right lower quadrant abdominal pain. EXAM: CT ABDOMEN AND PELVIS WITHOUT CONTRAST TECHNIQUE: Multidetector CT imaging of the abdomen and pelvis was performed following the standard protocol without IV contrast. COMPARISON:  None. FINDINGS: Lower chest: No acute abnormality. Hepatobiliary: No focal liver abnormality is seen. No gallstones, gallbladder wall thickening, or biliary dilatation. Pancreas: Unremarkable. No pancreatic ductal dilatation or surrounding inflammatory changes. Spleen: Normal in size without focal abnormality. Adrenals/Urinary Tract: Adrenal glands are unremarkable. Kidneys are normal, without renal calculi, focal lesion, or hydronephrosis. Bladder is unremarkable. Stomach/Bowel: Stomach is within normal limits. Appendix appears normal. No evidence of bowel wall thickening, distention, or inflammatory changes. Vascular/Lymphatic: Aortic atherosclerosis. No enlarged abdominal or pelvic lymph nodes. Reproductive: Prostate is unremarkable. Other: No abdominal wall hernia or abnormality. No abdominopelvic ascites. Musculoskeletal: No fracture is seen. Mild-to-moderate spondylosis of the lumbar spine with prominent left-sided facet arthropathy. IMPRESSION: 1. No acute process identified. 2.  Aortic Atherosclerosis (ICD10-I70.0). 3. 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 Electronically Signed    By: Mitzi Hansen M.D.   On: 08/25/2018 00:32    Procedures Procedures (including critical care time)  Medications Ordered in ED Medications  ketorolac (TORADOL) 30 MG/ML injection 15 mg (15 mg Intravenous Given 08/25/18 0051)       Final Clinical Impressions(s) / ED Diagnoses   Final diagnoses:  Flank pain   Follow up with your PMD will restart metformin. Ruled out for MI in the ED.   Return for pain, intractable cough, productive cough,fevers >100.4 unrelieved by medication, shortness of breath, intractable vomiting, or diarrhea, abdominal pain, Inability to tolerate liquids or food, cough, altered mental status or any concerns. No signs of systemic illness or infection. The patient is nontoxic-appearing on exam and vital signs are within normal limits.   I have reviewed the triage vital signs and the nursing notes. Pertinent labs &imaging results that were available during my care of the patient were reviewed by me and considered in my  medical decision making (see chart for details).  After history, exam, and medical workup I feel the patient has been appropriately medically screened and is safe for discharge home. Pertinent diagnoses were discussed with the patient. Patient was given return precautions.      Fedora Knisely, MD 08/25/18 [id number]

## 2018-08-25 NOTE — ED Notes (Addendum)
Pt dc instructions reviewed. Pt verbalized understanding. Denies pain or other sx. EDP aware of BP.

## 2018-08-25 NOTE — ED Notes (Signed)
Pt drinking ginger ale without difficulty  

## 2018-08-25 NOTE — ED Notes (Signed)
Pt returned from CT °

## 2018-09-26 IMAGING — DX DG HAND COMPLETE 3+V*R*
3 series · 3 of 3 positions shown · non-contrast
Comparison: None.

CLINICAL DATA: Right hand pain for several weeks

EXAM:
RIGHT HAND - COMPLETE 3+ VIEW

[hand pa]
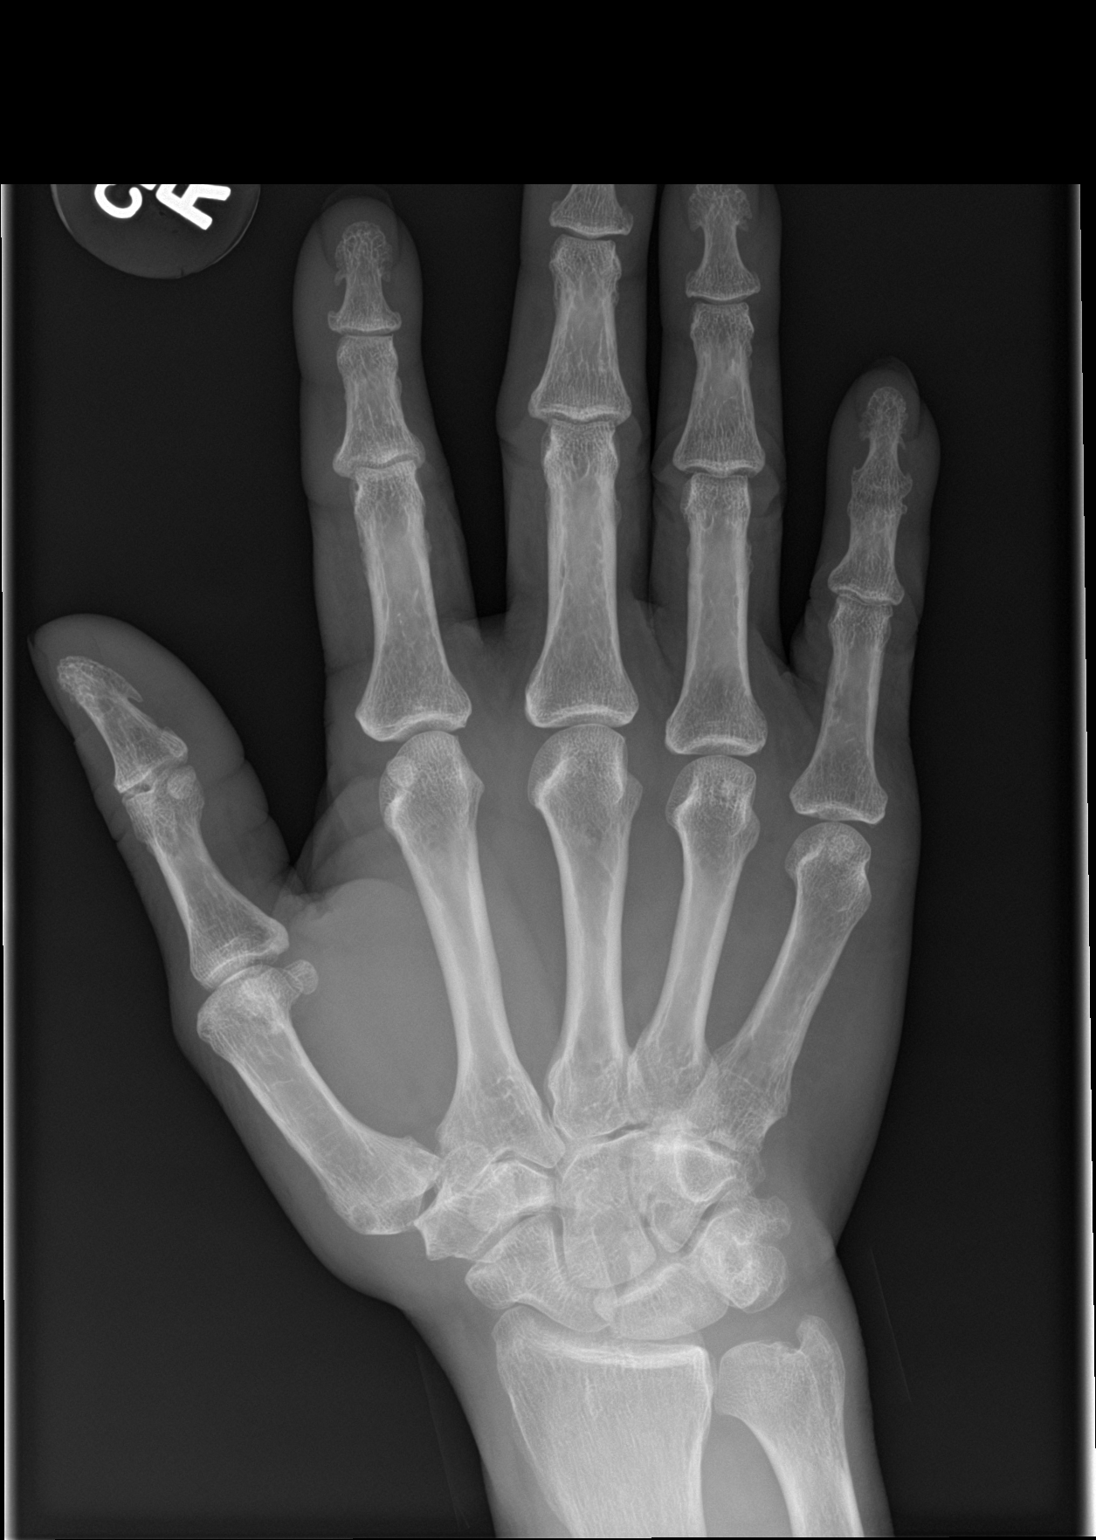

[hand obl]
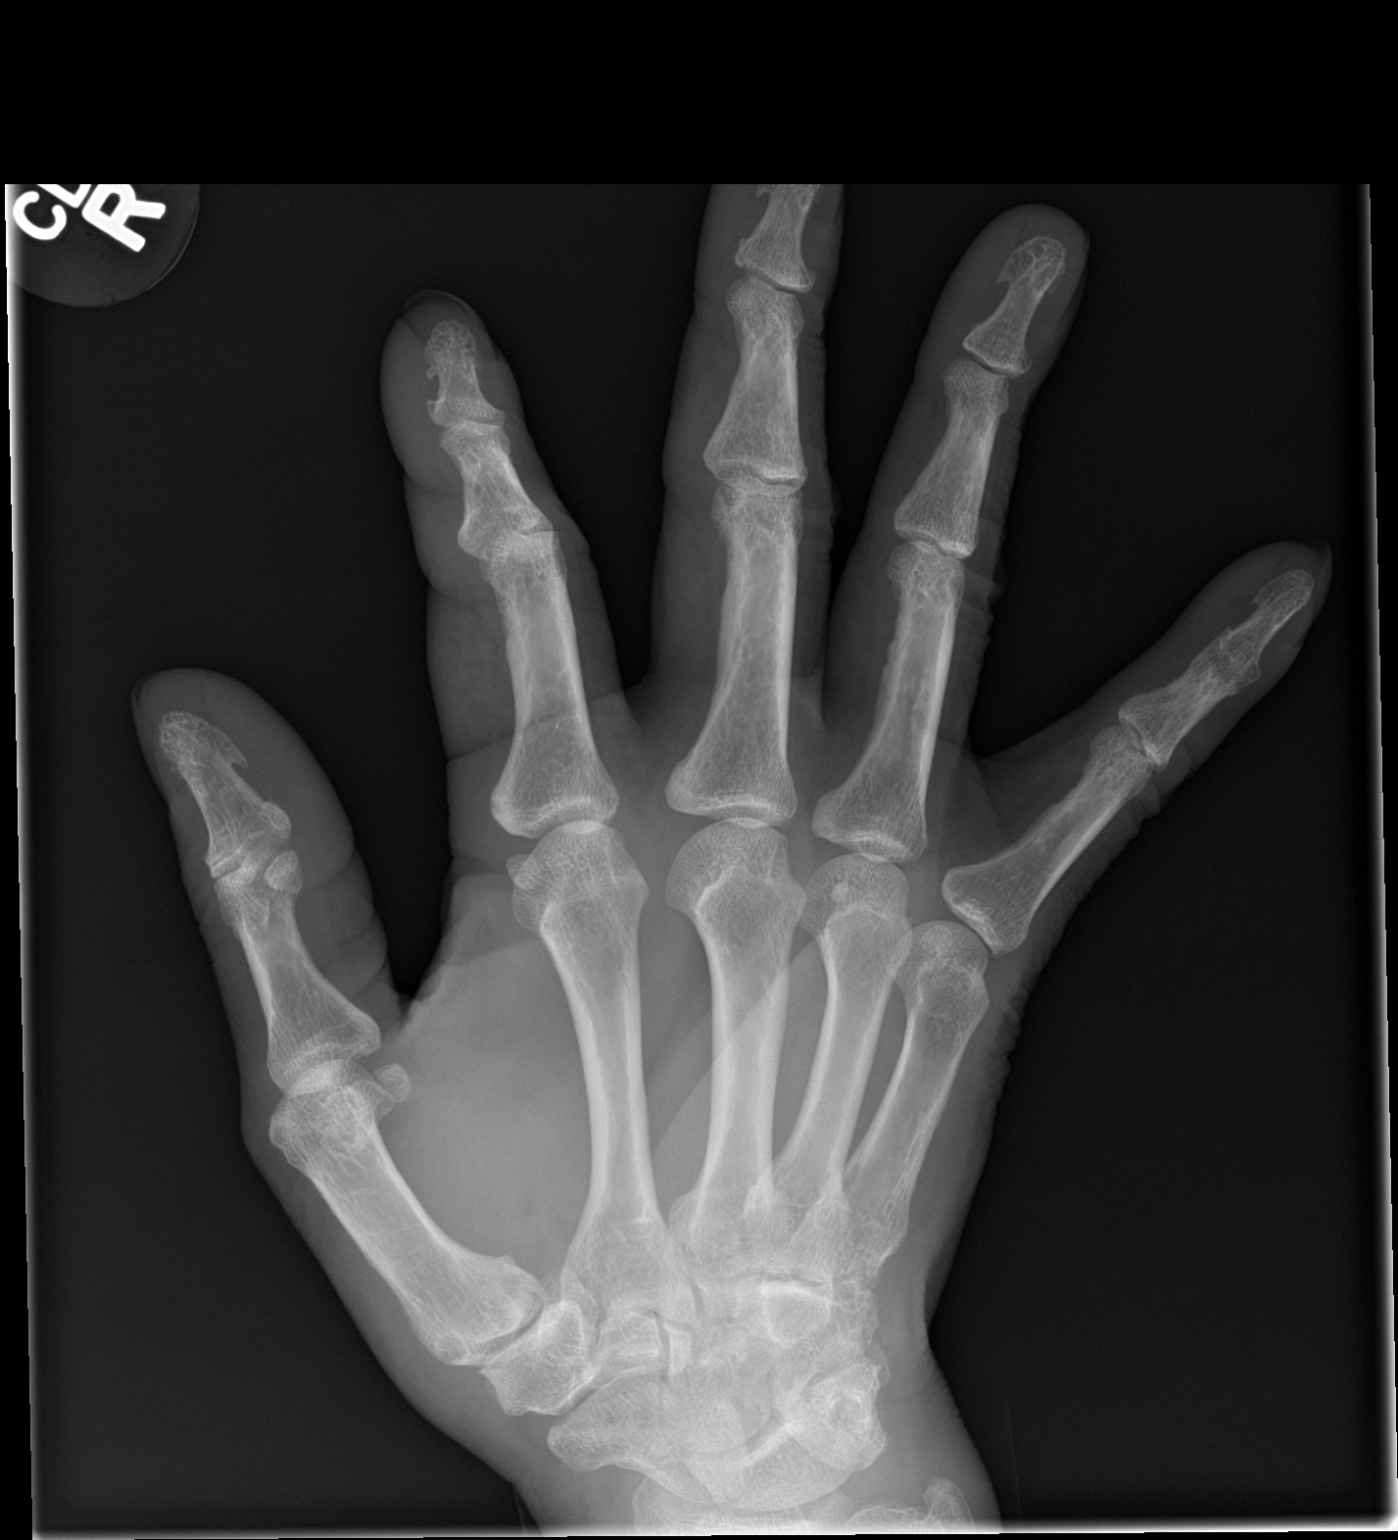

[hand lat]
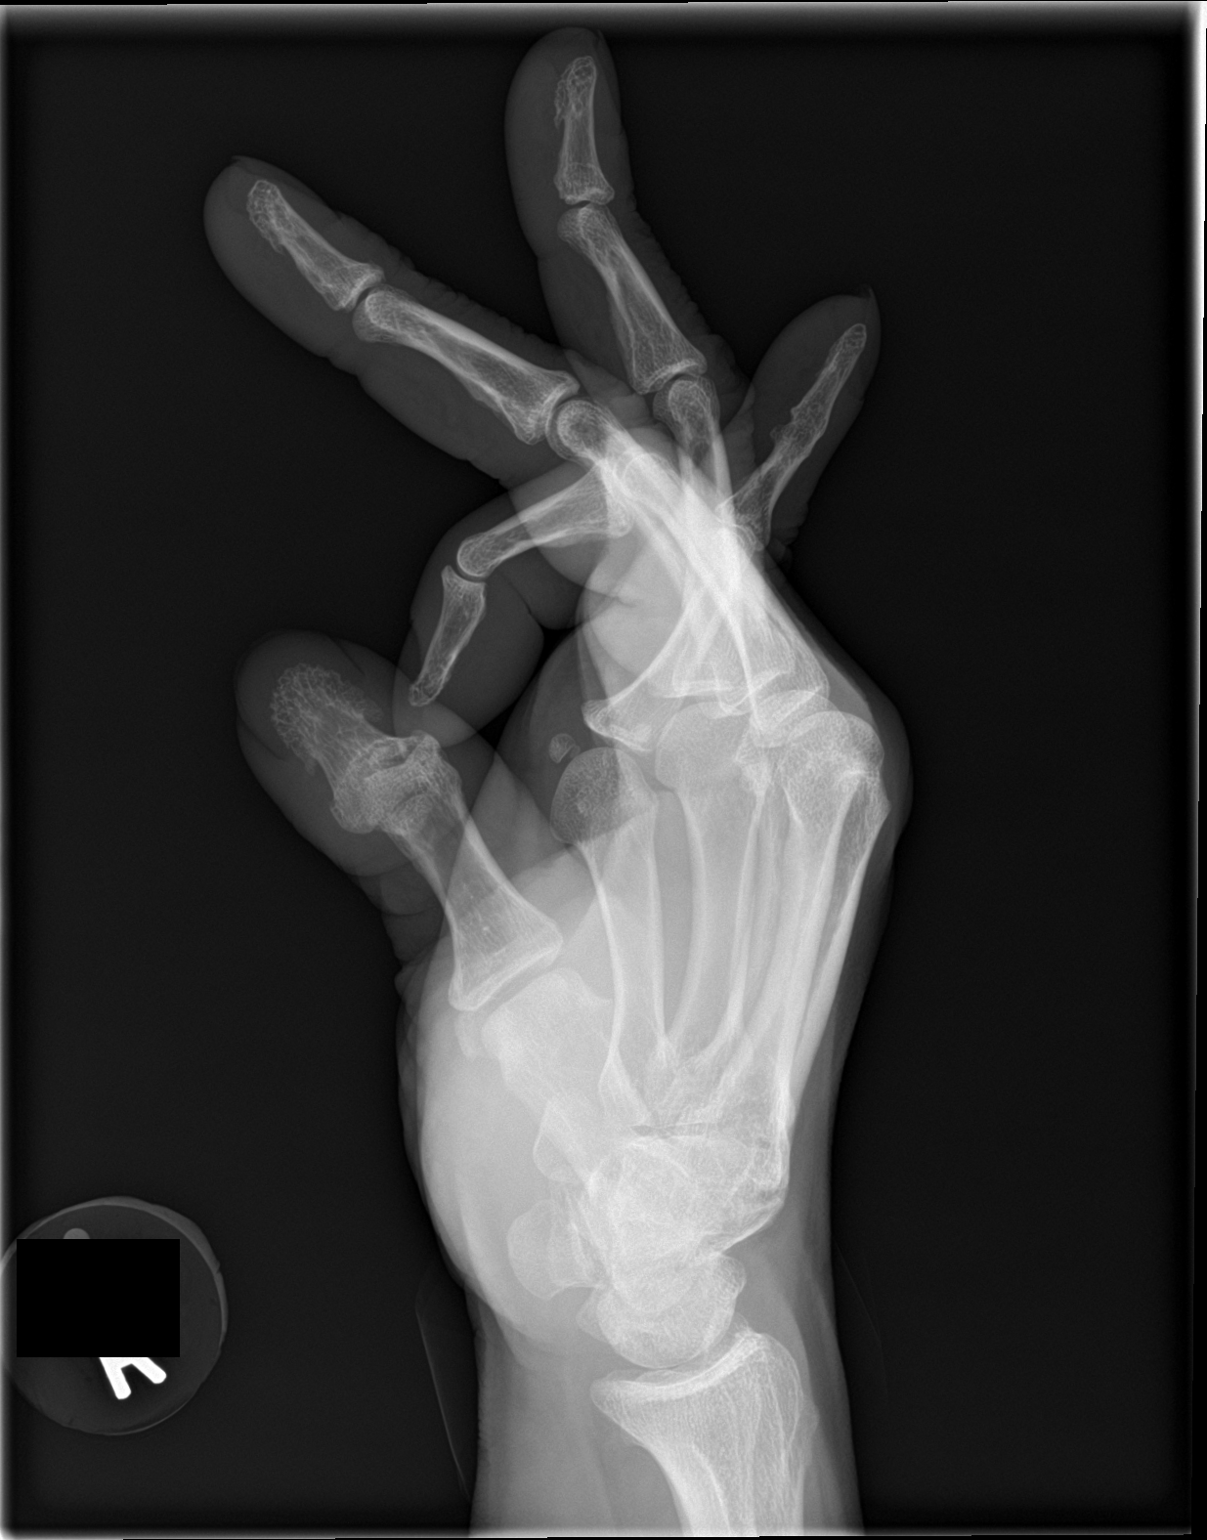

[3 of 3 positions shown; findings below may reference images not displayed]

FINDINGS: No fracture or malalignment. Fusion across the fifth DIP joint.
Minimal degenerative changes at the first CMC joint.
IMPRESSION: No acute osseous abnormality.
